# Patient Record
Sex: Female | Born: 2000 | Race: White | Hispanic: No | Marital: Single | Smoking: Current every day smoker
Health system: Southern US, Community
[De-identification: ages and names within clinical notes are randomized; demographics above are authoritative.]

## PROBLEM LIST (undated history)

## (undated) DIAGNOSIS — F909 Attention-deficit hyperactivity disorder, unspecified type: Secondary | ICD-10-CM

## (undated) DIAGNOSIS — E282 Polycystic ovarian syndrome: Secondary | ICD-10-CM

## (undated) DIAGNOSIS — F840 Autistic disorder: Secondary | ICD-10-CM

## (undated) DIAGNOSIS — R4689 Other symptoms and signs involving appearance and behavior: Secondary | ICD-10-CM

## (undated) DIAGNOSIS — R002 Palpitations: Secondary | ICD-10-CM

## (undated) HISTORY — DX: Palpitations: R00.2

---
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
## 2021-09-25 ENCOUNTER — Encounter: Payer: Self-pay | Admitting: Emergency Medicine

## 2021-09-25 ENCOUNTER — Emergency Department (INDEPENDENT_AMBULATORY_CARE_PROVIDER_SITE_OTHER)
Admission: EM | Admit: 2021-09-25 | Discharge: 2021-09-25 | Disposition: A | Discharge status: Home / Self Care | Payer: PRIVATE HEALTH INSURANCE | Source: Home / Self Care | Attending: Family Medicine | Admitting: Family Medicine

## 2021-09-25 DIAGNOSIS — J111 Influenza due to unidentified influenza virus with other respiratory manifestations: Secondary | ICD-10-CM | POA: Diagnosis not present

## 2021-09-25 LAB — POCT INFLUENZA A/B
Influenza A, POC: NEGATIVE
Influenza B, POC: NEGATIVE

## 2021-09-25 LAB — POC SARS CORONAVIRUS 2 AG -  ED: SARS Coronavirus 2 Ag: NEGATIVE

## 2021-09-25 NOTE — Discharge Instructions (Signed)
Continue drinking lots of fluids ?May take over-the-counter cough and cold medicine as needed ?Return as needed ? ?

## 2021-09-25 NOTE — ED Provider Notes (Signed)
?[facility] ? ? ? ?CSN: [id number] ?Arrival date & time: 09/25/21  1213 ? ? ?  ? ?History   ?Chief Complaint ?Chief Complaint  ?Patient presents with  ? Generalized Body Aches  ? ? ?HPI ?Christon Gallaway is a 21 y.o. adult.  ? ?HPI ? ?Neita Goodnight is here for upper respiratory infection.  Had a strep test earlier in the week primary care.  Continues to have a sore throat, runny nose, body aches, mental fog, and fatigue.  States he has had mono twice.  Strep test was negative.  Because of the worsening body aches wants additional testing.  Flu and COVID test done here today are negative.  Also complains of recurring thrush.  Neita Goodnight states he is immunocompromise because of his Crohn's disease and medication.  He gets recurring thrush.  Is currently using nystatin ? ?Past Medical History:  ?Diagnosis Date  ? ADHD   ? Autism   ? Body integrity dysphoria   ? PCOS (polycystic ovarian syndrome)   ? ? ?Patient Active Problem List  ? Diagnosis Date Noted  ? Bipolar 2 disorder (HCC) 11/06/2019  ? Attention deficit hyperactivity disorder (ADHD), combined type 10/17/2019  ? Crohn disease (HCC) 10/17/2019  ? 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 History of OCD (obsessive compulsive disorder) 10/17/2019  ? Anxiety 01/06/2017  ? ? ?History reviewed. No pertinent surgical history. ? ?OB History   ?No obstetric history on file. ?  ? ? ? ?Home Medications   ? ?Prior to Admission medications   ?Medication Sig Start Date End Date Taking? Authorizing Provider  ?nystatin (MYCOSTATIN) 100000 UNIT/ML suspension Take by mouth. 09/23/21 10/03/21 Yes [provider]  ?busPIRone (BUSPAR) 10 MG tablet Take 10 mg by mouth at bedtime. 05/26/21   [provider]  ?dutasteride (AVODART) 0.5 MG capsule Take 0.5 mg by mouth daily. 08/23/21   [provider]  ?gabapentin (NEURONTIN) 300 MG capsule SMARTSIG:1 Capsule(s) By Mouth 1-2 Times Daily PRN 05/26/21   [provider]  ?testosterone cypionate (DEPOTESTOSTERONE CYPIONATE)  200 MG/ML injection Inject into the muscle. 12/31/19   [provider]  ? ? ?Family History ?Family History  ?Problem Relation Age of Onset  ? Healthy Mother   ? Hypertension Father   ? Crohn's disease Sister   ? Crohn's disease Brother   ? Healthy Sister   ? ? ?Social History ?Social History  ? ?Tobacco Use  ? Smoking status: Some Days  ?  Types: E-cigarettes  ? Smokeless tobacco: Never  ?Vaping Use  ? Vaping Use: Some days  ? Substances: Nicotine  ?Substance Use Topics  ? Alcohol use: Never  ? Drug use: Never  ? ? ? ?Allergies   ?Pineapple ? ? ?Review of Systems ?Review of Systems ?See HPI ? ?Physical Exam ?Triage Vital Signs ?ED Triage Vitals  ?Enc Vitals Group  ?   BP 09/25/21 1232 109/75  ?   Pulse Rate 09/25/21 1232 83  ?   Resp 09/25/21 1232 15  ?   Temp 09/25/21 1232 98.4 ?F (36.9 ?C)  ?   Temp Source 09/25/21 1232 Oral  ?   SpO2 09/25/21 1232 97 %  ?   Weight 09/25/21 1235 155 lb (70.3 kg)  ?   Height 09/25/21 1235 5' 4.5" (1.638 m)  ?   Head Circumference --   ?   Peak Flow --   ?   Pain Score 09/25/21 1234 4  ?   Pain Loc --   ?   Pain  Edu? --   ?   Excl. in GC? --   ? ?No data found. ? ?Updated Vital Signs ?BP 109/75 (BP Location: Left Arm)   Pulse 83   Temp 98.4 ?F (36.9 ?C) (Oral)   Resp 15   Ht 5' 4.5" (1.638 m)   Wt 70.3 kg   SpO2 97%   BMI 26.19 kg/m?  ?   ? ?Physical Exam ?Constitutional:   ?   General: He is not in acute distress. ?   Appearance: He is well-developed and normal weight.  ?HENT:  ?   Head: Normocephalic and atraumatic.  ?   Right Ear: Tympanic membrane and ear canal normal.  ?   Left Ear: Tympanic membrane and ear canal normal.  ?   Nose: Nose normal.  ?   Mouth/Throat:  ?   Pharynx: Posterior oropharyngeal erythema present.  ?Eyes:  ?   Conjunctiva/sclera: Conjunctivae normal.  ?   Pupils: Pupils are equal, round, and reactive to light.  ?Cardiovascular:  ?   Rate and Rhythm: Normal rate.  ?Pulmonary:  ?   Effort: Pulmonary effort is normal. No respiratory distress.   ?   Breath sounds: Normal breath sounds.  ?Abdominal:  ?   General: There is no distension.  ?   Palpations: Abdomen is soft.  ?Musculoskeletal:     ?   General: Normal range of motion.  ?   Cervical back: Normal range of motion.  ?Lymphadenopathy:  ?   Cervical: No cervical adenopathy.  ?Skin: ?   General: Skin is warm and dry.  ?Neurological:  ?   General: No focal deficit present.  ?   Mental Status: He is alert.  ? ? ? ?UC Treatments / Results  ?Labs ?(all labs ordered are listed, but only abnormal results are displayed) ?Labs Reviewed  ?POC SARS CORONAVIRUS 2 AG -  ED  ?POCT INFLUENZA A/B  ? ? ?EKG ? ? ?Radiology ?No results found. ? ?Procedures ?Procedures (including critical care time) ? ?Medications Ordered in UC ?Medications - No data to display ? ?Initial Impression / Assessment and Plan / UC Course  ?I have reviewed the triage vital signs and the nursing notes. ? ?Pertinent labs & imaging results that were available during my care of the patient were reviewed by me and considered in my medical decision making (see chart for details). ? ?  ? ?COVID, flu, and strep test is negative.  Discussed viral illness and symptomatic care. ?Oropharynx looks benign.  Do not see thrush plaques.  Patient states that he has been treating this at home for couple of days and it is improving. ?Final Clinical Impressions(s) / UC Diagnoses  ? ?Final diagnoses:  ?Influenza-like illness  ? ? ? ?Discharge Instructions   ? ?  ?Continue drinking lots of fluids ?May take over-the-counter cough and cold medicine as needed ?Return as needed ? ? ? ?ED Prescriptions   ?None ?  ? ?PDMP not reviewed this encounter. ?  ?Eustace Moore, MD ?09/25/21 1335 ? ?

## 2021-09-25 NOTE — ED Triage Notes (Signed)
Pt has thrush again  ?Pt  had thrush  3 weeks ago  ?Currently on medicine for thrush  (started Thursday) ?Negative rapid Strep & culture on Wednesday  ?Increased body aches w/ runny nose & cough ?Fatigue  ?Tylenol 1 gm at 0745 ?Pt denies being sexually active  ?No one is sick at home  ? ?

## 2021-11-24 ENCOUNTER — Emergency Department (INDEPENDENT_AMBULATORY_CARE_PROVIDER_SITE_OTHER)
Admission: EM | Admit: 2021-11-24 | Discharge: 2021-11-24 | Disposition: A | Discharge status: Home / Self Care | Payer: PRIVATE HEALTH INSURANCE | Source: Home / Self Care | Attending: Family Medicine | Admitting: Family Medicine

## 2021-11-24 ENCOUNTER — Encounter: Payer: Self-pay | Admitting: Emergency Medicine

## 2021-11-24 DIAGNOSIS — R002 Palpitations: Secondary | ICD-10-CM

## 2021-11-24 NOTE — ED Triage Notes (Signed)
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

## 2021-11-24 NOTE — Discharge Instructions (Signed)
If symptoms become significantly worse during the night or over the weekend, proceed to the local emergency room.  

## 2021-11-25 ENCOUNTER — Telehealth: Payer: Self-pay

## 2021-11-25 LAB — COMPLETE METABOLIC PANEL WITH GFR
AG Ratio: 1.9 (calc) (ref 1.0–2.5)
ALT: 11 U/L (ref 6–29)
AST: 17 U/L (ref 10–30)
Albumin: 4.7 g/dL (ref 3.6–5.1)
Alkaline phosphatase (APISO): 79 U/L (ref 31–125)
BUN: 12 mg/dL (ref 7–25)
CO2: 23 mmol/L (ref 20–32)
Calcium: 9.4 mg/dL (ref 8.6–10.2)
Chloride: 104 mmol/L (ref 98–110)
Creat: 0.81 mg/dL (ref 0.50–0.96)
Globulin: 2.5 g/dL (calc) (ref 1.9–3.7)
Glucose, Bld: 82 mg/dL (ref 65–99)
Potassium: 4.3 mmol/L (ref 3.5–5.3)
Sodium: 139 mmol/L (ref 135–146)
Total Bilirubin: 0.9 mg/dL (ref 0.2–1.2)
Total Protein: 7.2 g/dL (ref 6.1–8.1)
eGFR: 107 mL/min/{1.73_m2} (ref >=60)

## 2021-11-25 LAB — CBC WITH DIFFERENTIAL/PLATELET
Absolute Monocytes: 274 cells/uL (ref 200–950)
Basophils Absolute: 20 cells/uL (ref 0–200)
Basophils Relative: 0.4 %
Eosinophils Absolute: 0 cells/uL — ABNORMAL LOW (ref 15–500)
Eosinophils Relative: 0 %
HCT: 45.9 % — ABNORMAL HIGH (ref 35.0–45.0)
Hemoglobin: 15.5 g/dL (ref 11.7–15.5)
Lymphs Abs: 1568 cells/uL (ref 850–3900)
MCH: 31.2 pg (ref 27.0–33.0)
MCHC: 33.8 g/dL (ref 32.0–36.0)
MCV: 92.4 fL (ref 80.0–100.0)
MPV: 9.7 fL (ref 7.5–12.5)
Monocytes Relative: 5.6 %
Neutro Abs: 3038 cells/uL (ref 1500–7800)
Neutrophils Relative %: 62 %
Platelets: 234 10*3/uL (ref 140–400)
RBC: 4.97 10*6/uL (ref 3.80–5.10)
RDW: 12.8 % (ref 11.0–15.0)
Total Lymphocyte: 32 %
WBC: 4.9 10*3/uL (ref 3.8–10.8)

## 2021-11-25 LAB — T4, FREE: Free T4: 1.2 ng/dL (ref 0.8–1.4)

## 2021-11-25 LAB — TSH: TSH: 1.36 mIU/L

## 2021-11-25 NOTE — Telephone Encounter (Signed)
Call made to pt. Informed of normal lab results. Had f/u with heartcare at end of Aug. Recommended calling and asking about getting on cancellation list. ED if sxs worsen. Please call if any additional questions.

## 2021-11-27 NOTE — ED Provider Notes (Signed)
[facility]    CSN: [id number] Arrival date & time: 11/24/21  1203      History   Chief Complaint Chief Complaint  Patient presents with   Tachycardia    HPI Erika Estrada is a 21 y.o. adult.   Patient complains of having an intermittently rapid heart rate for the past 3 days.  During this time he has felt fatigued and shaky at times.  Yesterday his heart rate spontaneously increased to 130 for about 2 hours, occasionally reaching 150.  He felt somewhat light-headed with nausea but no vomiting.  His symptoms do not improve after eating.  He feels well otherwise without chest pain, shortness of breath, fever, etc, and is assymptomatic at present.  He has a past history of panic disorder. He has a family history of tachycardia in his maternal grandmother who required ablation. Review of previous records reveals a normal TSH and CBC on 09/16/20.  The history is provided by the patient.    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 No pertinent surgical history.  OB History   No obstetric history on file.      Home Medications    Prior to Admission medications   Medication Sig Start Date End Date Taking? Authorizing Provider  busPIRone (BUSPAR) 10 MG tablet Take 10 mg by mouth at bedtime. Patient not taking: Reported on 11/24/2021 05/26/21   [provider]  dutasteride (AVODART) 0.5 MG capsule Take 0.5 mg by mouth daily. 08/23/21   [provider]  gabapentin (NEURONTIN) 300 MG capsule SMARTSIG:1 Capsule(s) By Mouth 1-2 Times Daily  PRN 05/26/21   [provider]  testosterone cypionate (DEPOTESTOSTERONE CYPIONATE) 200 MG/ML injection Inject into the muscle. 12/31/19   [provider]  VRAYLAR 1.5 MG capsule Take 1.5 mg by mouth. 11/11/21   [provider]  zaleplon (SONATA) 5 MG capsule Take 5 mg by mouth at bedtime. 11/17/21   [provider]    Family History Family History  Problem Relation Age of Onset   Healthy Mother    Hypertension Father    Crohn's disease Sister    Crohn's disease Brother    Healthy Sister     Social History Social History   Tobacco Use   Smoking status: Some Days    Types: E-cigarettes   Smokeless tobacco: Never  Vaping Use   Vaping Use: Some days   Substances: Nicotine  Substance Use Topics   Alcohol use: Never   Drug use: Never     Allergies   Pineapple   Review of Systems Review of Systems  Constitutional:  Positive for fatigue. Negative for activity change, appetite change, chills, diaphoresis and fever.  HENT: Negative.    Eyes: Negative.   Respiratory: Negative.    Cardiovascular:  Positive for palpitations. Negative for chest pain and leg swelling.  Gastrointestinal:  Positive for nausea. Negative for abdominal pain and vomiting.  Endocrine: Negative.   Genitourinary: Negative.   Musculoskeletal: Negative.   Skin: Negative.   Neurological: Negative.   Hematological: Negative.  Psychiatric/Behavioral: Negative.       Physical Exam Triage Vital Signs ED Triage Vitals  Enc Vitals Group     BP 11/24/21 1230 110/79     Pulse Rate 11/24/21 1230 95     Resp 11/24/21 1230 15     Temp 11/24/21 1230 99.3 F (37.4 C)     Temp Source 11/24/21 1230 Oral     SpO2 11/24/21 1230 98 %     Weight 11/24/21 1234 145 lb (65.8 kg)     Height 11/24/21 1234 5' 4.5" (1.638 m)     Head Circumference --      Peak Flow --      Pain Score 11/24/21 1234 2     Pain Loc --      Pain Edu? --      Excl. in [place]? --    No data  found.  Updated Vital Signs BP 110/79 (BP Location: Left Arm)   Pulse 95   Temp 99.3 F (37.4 C) (Oral)   Resp 15   Ht 5' 4.5" (1.638 m)   Wt 65.8 kg   LMP 10/27/2021   SpO2 98%   BMI 24.50 kg/m   Visual Acuity Right Eye Distance:   Left Eye Distance:   Bilateral Distance:    Right Eye Near:   Left Eye Near:    Bilateral Near:     Physical Exam Vitals and nursing note reviewed.  Constitutional:      General: He is not in acute distress.    Appearance: Normal appearance. He is not ill-appearing.  HENT:     Head: Normocephalic.     Right Ear: External ear normal.     Left Ear: External ear normal.     Nose: Nose normal.     Mouth/Throat:     Mouth: Mucous membranes are moist.     Pharynx: Oropharynx is clear.  Eyes:     Extraocular Movements: Extraocular movements intact.     Conjunctiva/sclera: Conjunctivae normal.     Pupils: Pupils are equal, round, and reactive to light.  Neck:     Comments: No thyromegaly Cardiovascular:     Rate and Rhythm: Normal rate and regular rhythm.     Heart sounds: Normal heart sounds.  Pulmonary:     Breath sounds: Normal breath sounds.  Abdominal:     Palpations: Abdomen is soft.     Tenderness: There is no abdominal tenderness.  Musculoskeletal:     Cervical back: Neck supple.     Right lower leg: No edema.     Left lower leg: No edema.  Lymphadenopathy:     Cervical: No cervical adenopathy.  Skin:    General: Skin is warm and dry.  Neurological:     Mental Status: He is alert and oriented to person, place, and time.  Psychiatric:        Mood and Affect: Mood normal.        Behavior: Behavior normal.      UC Treatments / Results  Labs (all labs ordered are listed, but only abnormal results are displayed) Labs Reviewed  CBC WITH DIFFERENTIAL/PLATELET - Abnormal; Notable for the following components:      Result Value   HCT 45.9 (*)    Eosinophils Absolute 0 (*)    All other components within normal limits  TSH   COMPLETE METABOLIC PANEL WITH GFR  T4, FREE    EKG  Rate:   86 BPM PR:  144 msec QT:  356 msec QTcH:   426 msec QRSD:   90 msec QRS axis:   79 degrees Interpretation:  Normal EKG; normal sinus rhythm with sinus arrhythmia.  Radiology No results found.  Procedures Procedures (including critical care time)  Medications Ordered in UC Medications - No data to display  Initial Impression / Assessment and Plan / UC Course  I have reviewed the triage vital signs and the nursing notes.  Pertinent labs & imaging results that were available during my care of the patient were reviewed by me and considered in my medical decision making (see chart for details).    Benign exam.  Note normal exam and EKG.  Recommend follow-up with cardiologist or PCP for event monitor. Check TSH, CBC, CMP, free T4  Final Clinical Impressions(s) / UC Diagnoses   Final diagnoses:  Palpitations     Discharge Instructions      If symptoms become significantly worse during the night or over the weekend, proceed to the local emergency room.     ED Prescriptions   None       Lattie Haw, MD 11/27/21 1303

## 2021-12-14 ENCOUNTER — Ambulatory Visit (INDEPENDENT_AMBULATORY_CARE_PROVIDER_SITE_OTHER): Payer: PRIVATE HEALTH INSURANCE | Admitting: Interventional Cardiology

## 2021-12-14 ENCOUNTER — Encounter: Payer: Self-pay | Admitting: *Deleted

## 2021-12-14 ENCOUNTER — Encounter: Payer: Self-pay | Admitting: Interventional Cardiology

## 2021-12-14 ENCOUNTER — Ambulatory Visit (INDEPENDENT_AMBULATORY_CARE_PROVIDER_SITE_OTHER): Payer: PRIVATE HEALTH INSURANCE

## 2021-12-14 VITALS — BP 113/70 | Ht 64.0 in | Wt 145.0 lb

## 2021-12-14 DIAGNOSIS — I479 Paroxysmal tachycardia, unspecified: Secondary | ICD-10-CM | POA: Diagnosis not present

## 2021-12-14 DIAGNOSIS — R002 Palpitations: Secondary | ICD-10-CM

## 2021-12-14 NOTE — Progress Notes (Signed)
Cardiology Office Note   Date:  12/14/2021   ID:  Erika Estrada, DOB 2001/04/21, MRN [id number]  PCP:  Erika Dicker, FNP    No chief complaint on file.  palpitations  Wt Readings from Last 3 Encounters:  12/14/21 145 lb (65.8 kg)  11/24/21 145 lb (65.8 kg)  09/25/21 155 lb (70.3 kg)       History of Present Illness: Erika Estrada is a 21 y.o. adult who is being seen today for the evaluation of palpitations at the request of Erika Haw, MD.   He has had several trips to the emergency room in the past month due to palpitations.  Records show: "heart palpitations that started on Monday. States that he has been measuring his heart rate and has gone up to about 140. When his heart rate goes up in the has been having some dizziness and tremors. Patient also reports rectal bleeding. States that he has a history of Crohn's disease and has noted some bright red bleeding when wiping. He denies fever, chills, vomiting, shortness of breath. When he has the palpitations he states he has some chest pain. Currently denying any chest pain, currently denying palpitations. Denies urinary symptoms like burning or increased frequency. No urinary discharge."  He is also noted that heart rate increases with standing or with any activity.  He had heme positive stool and has a history of Crohn's disease.  Of note, hemoglobin was 15.5 on November 24, 2021.  Will be seeing GI, Dr. Sharol Estrada- [facility].   HR has been high at various jobs, up to 170s.  He quit one job due to the high HR.   Denies : Chest pain. Leg edema. Nitroglycerin use. Orthopnea. Palpitations. Paroxysmal nocturnal dyspnea.  Syncope.     Past Medical History:  Diagnosis Date   ADHD    Autism    Body integrity dysphoria    Palpitation    PCOS (polycystic ovarian syndrome)     No past surgical history on file.   Current Outpatient Medications  Medication Sig Dispense Refill   dutasteride (AVODART) 0.5 MG capsule Take 0.5 mg by  mouth daily.     eszopiclone (LUNESTA) 2 MG TABS tablet Take 2 mg by mouth at bedtime as needed for sleep.     gabapentin (NEURONTIN) 300 MG capsule SMARTSIG:1 Capsule(s) By Mouth 1-2 Times Daily PRN     testosterone cypionate (DEPOTESTOSTERONE CYPIONATE) 200 MG/ML injection Inject into the muscle.     VRAYLAR 1.5 MG capsule Take 1.5 mg by mouth.     No current facility-administered medications for this visit.    Allergies:   Pineapple    Social History:  The patient  reports that he has been smoking e-cigarettes. He has never used smokeless tobacco. He reports that he does not drink alcohol and does not use drugs.   Family History:  The patient's family history includes Crohn's disease in his brother and sister; Healthy in his mother and sister; Hypertension in his father. Grandmother with tachycardia, ablation in her 29s.    ROS:  Please see the history of present illness.   Otherwise, review of systems are positive for palpitations.   All other systems are reviewed and negative.    PHYSICAL EXAM: VS:  Ht 5\' 4"  (1.626 m)   Wt 145 lb (65.8 kg)   LMP 10/27/2021   SpO2 98%   BMI 24.89 kg/m  , BMI Body mass index is 24.89 kg/m. GEN: Well nourished, well developed,  in no acute distress HEENT: normal Neck: no JVD, carotid bruits, or masses Cardiac: RRR; no murmurs, rubs, or gallops,no edema  Respiratory:  clear to auscultation bilaterally, normal work of breathing GI: soft, nontender, nondistended, + BS MS: no deformity or atrophy Skin: warm and dry, no rash Neuro:  Strength and sensation are intact Psych: euthymic mood, full affect   EKG:   The ekg ordered 11/24/21 demonstrates normal ECG   Recent Labs: 11/24/2021: ALT 11; BUN 12; Creat 0.81; Hemoglobin 15.5; Platelets 234; Potassium 4.3; Sodium 139; TSH 1.36   Lipid Panel No results found for: "CHOL", "TRIG", "HDL", "CHOLHDL", "VLDL", "LDLCALC", "LDLDIRECT"   Other studies Reviewed: Additional studies/ records that were  reviewed today with results demonstrating: ER records reviewed.   ASSESSMENT AND PLAN:  Palpitations: Heart rates ranging anywhere from 140s to 170s.  Need to determine whether this is sinus tachycardia or if this is an actual SVT.  We will plan for 2 weeks Zio patch. Tachycardia: Noted above we will check echocardiogram to ensure normal LV and RV function. We discussed POTS.  I emphasized the importance of staying well-hydrated.  Increasing salt intake may also be helpful.  The symptoms sounds a lot like POTS.  Of note, the patient has decreased salt intake recently in an effort to decrease intake of processed foods. Of note, no significant change in systolic BP when changing position.  113 to 117 mm Hg.    Current medicines are reviewed at length with the patient today.  The patient concerns regarding his medicines were addressed.  The following changes have been made:  No change  Labs/ tests ordered today include:   Orders Placed This Encounter  Procedures   LONG TERM MONITOR (3-14 DAYS)   ECHOCARDIOGRAM COMPLETE    Recommend 150 minutes/week of aerobic exercise Low fat, low carb, high fiber diet recommended  Disposition:   FU based on test results   Signed, Lance Muss, MD  12/14/2021 2:39 PM    [facility] [address] [place], [place], [place]  [address] Phone: [phone]; Fax: [phone]

## 2021-12-14 NOTE — Progress Notes (Unsigned)
Patient ID: Erika Estrada, adult   DOB: 2001-02-25, 21 y.o.   MRN: [id number] Patient plans on swimming within the next 2 weeks so, patient enrolled for [employer] to ship a waterproof 14 day long term monitor to his address on file.    Dr. Eldridge Dace to read.

## 2021-12-14 NOTE — Patient Instructions (Addendum)
Medication Instructions:  Your physician recommends that you continue on your current medications as directed. Please refer to the Current Medication list given to you today.  *If you need a refill on your cardiac medications before your next appointment, please call your pharmacy*   Lab Work: none If you have labs (blood work) drawn today and your tests are completely normal, you will receive your results only by: MyChart Message (if you have MyChart) OR A paper copy in the mail If you have any lab test that is abnormal or we need to change your treatment, we will call you to review the results.   Testing/Procedures: Your physician has requested that you have an echocardiogram. Echocardiography is a painless test that uses sound waves to create images of your heart. It provides your doctor with information about the size and shape of your heart and how well your heart's chambers and valves are working. This procedure takes approximately one hour. There are no restrictions for this procedure.   Dr Eldridge Dace recommends you wear a monitor for 2 weeks.   Follow-Up: At [facility], you and your health needs are our priority.  As part of our continuing mission to provide you with exceptional heart care, we have created designated Provider Care Teams.  These Care Teams include your primary Cardiologist (physician) and Advanced Practice Providers (APPs -  Physician Assistants and Nurse Practitioners) who all work together to provide you with the care you need, when you need it.  We recommend signing up for the patient portal called "MyChart".  Sign up information is provided on this After Visit Summary.  MyChart is used to connect with patients for Virtual Visits (Telemedicine).  Patients are able to view lab/test results, encounter notes, upcoming appointments, etc.  Non-urgent messages can be sent to your provider as well.   To learn more about what you can do with MyChart, go to  [email or web address].    Your next appointment:   As needed  The format for your next appointment:   In Person  Provider:   Lance Muss, MD     Other Instructions    Important Information About Sugar

## 2021-12-14 NOTE — Progress Notes (Unsigned)
Patient plans on swimming within next 2 weeks.  [employer] 14 day waterproof long term monitor shipped to his address on file.  Dr. Abe People to read.

## 2021-12-17 ENCOUNTER — Ambulatory Visit (HOSPITAL_COMMUNITY): Payer: PRIVATE HEALTH INSURANCE | Attending: Interventional Cardiology

## 2021-12-17 DIAGNOSIS — R002 Palpitations: Secondary | ICD-10-CM | POA: Diagnosis not present

## 2021-12-17 DIAGNOSIS — I479 Paroxysmal tachycardia, unspecified: Secondary | ICD-10-CM | POA: Diagnosis present

## 2021-12-17 LAB — ECHOCARDIOGRAM COMPLETE
Area-P 1/2: 3.06 cm2
S' Lateral: 3 cm

## 2021-12-20 DIAGNOSIS — R002 Palpitations: Secondary | ICD-10-CM

## 2021-12-20 DIAGNOSIS — I479 Paroxysmal tachycardia, unspecified: Secondary | ICD-10-CM | POA: Diagnosis not present

## 2022-01-12 ENCOUNTER — Ambulatory Visit: Payer: PRIVATE HEALTH INSURANCE | Admitting: Cardiology

## 2022-01-28 IMAGING — DX DG FOOT COMPLETE 3+V*R*
3 series · 3 of 3 positions shown · non-contrast
Comparison: None.

CLINICAL DATA: Right foot pain

EXAM:
RIGHT FOOT COMPLETE - 3+ VIEW

[foot ap]
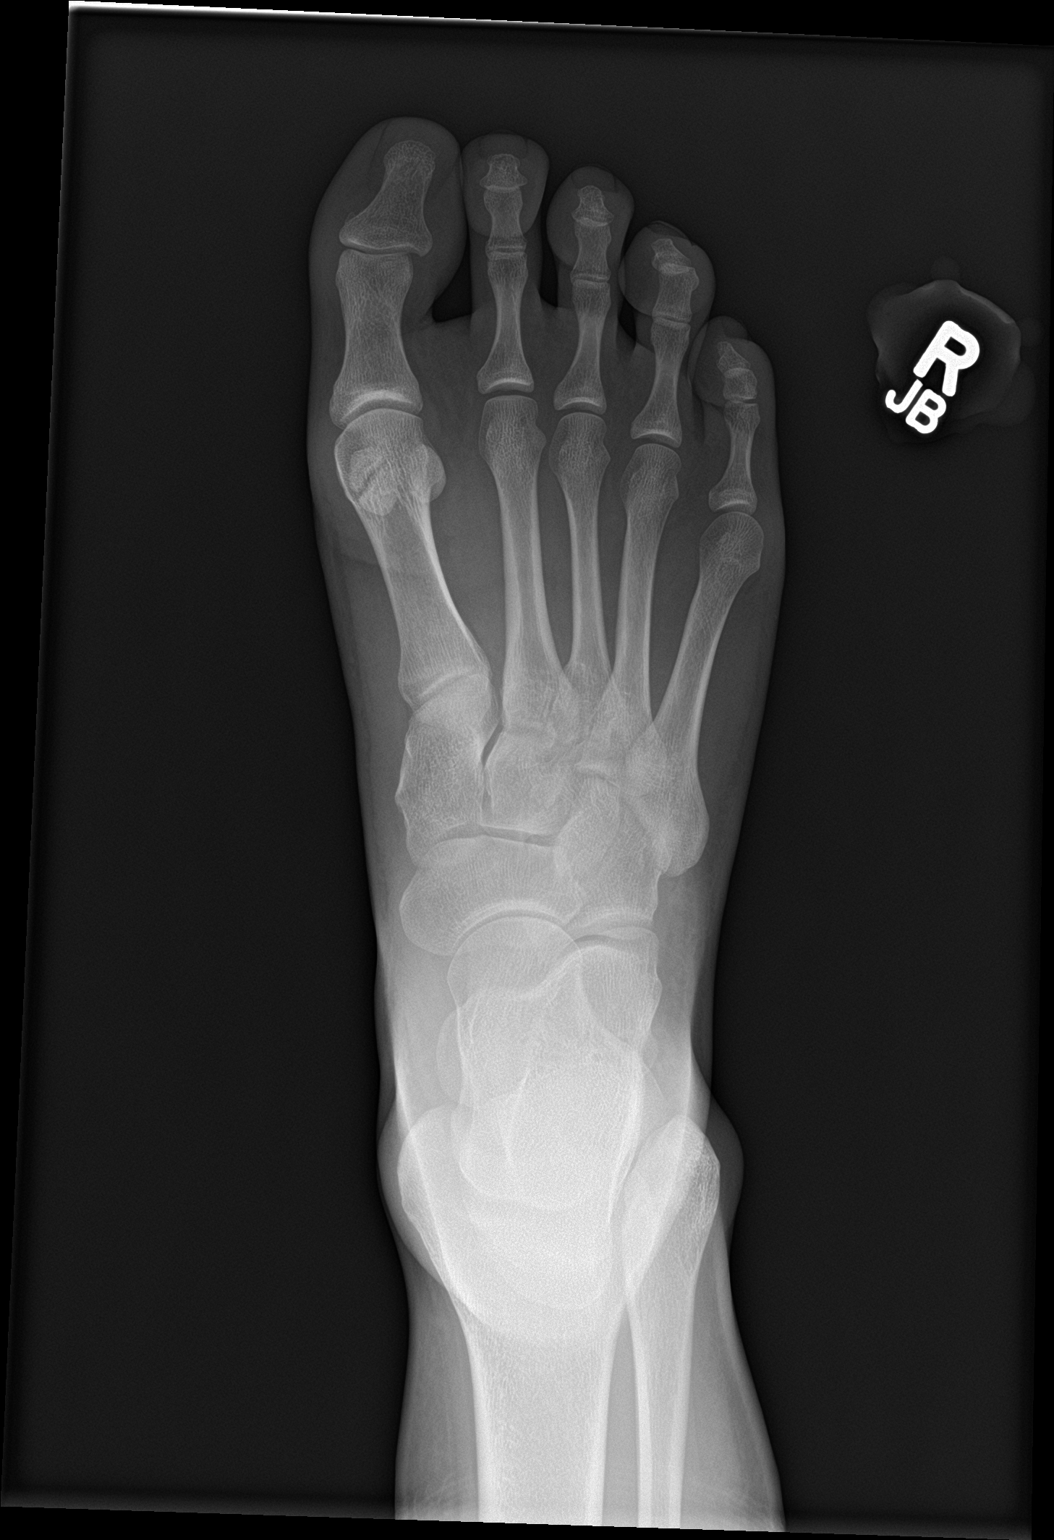

[foot obl]
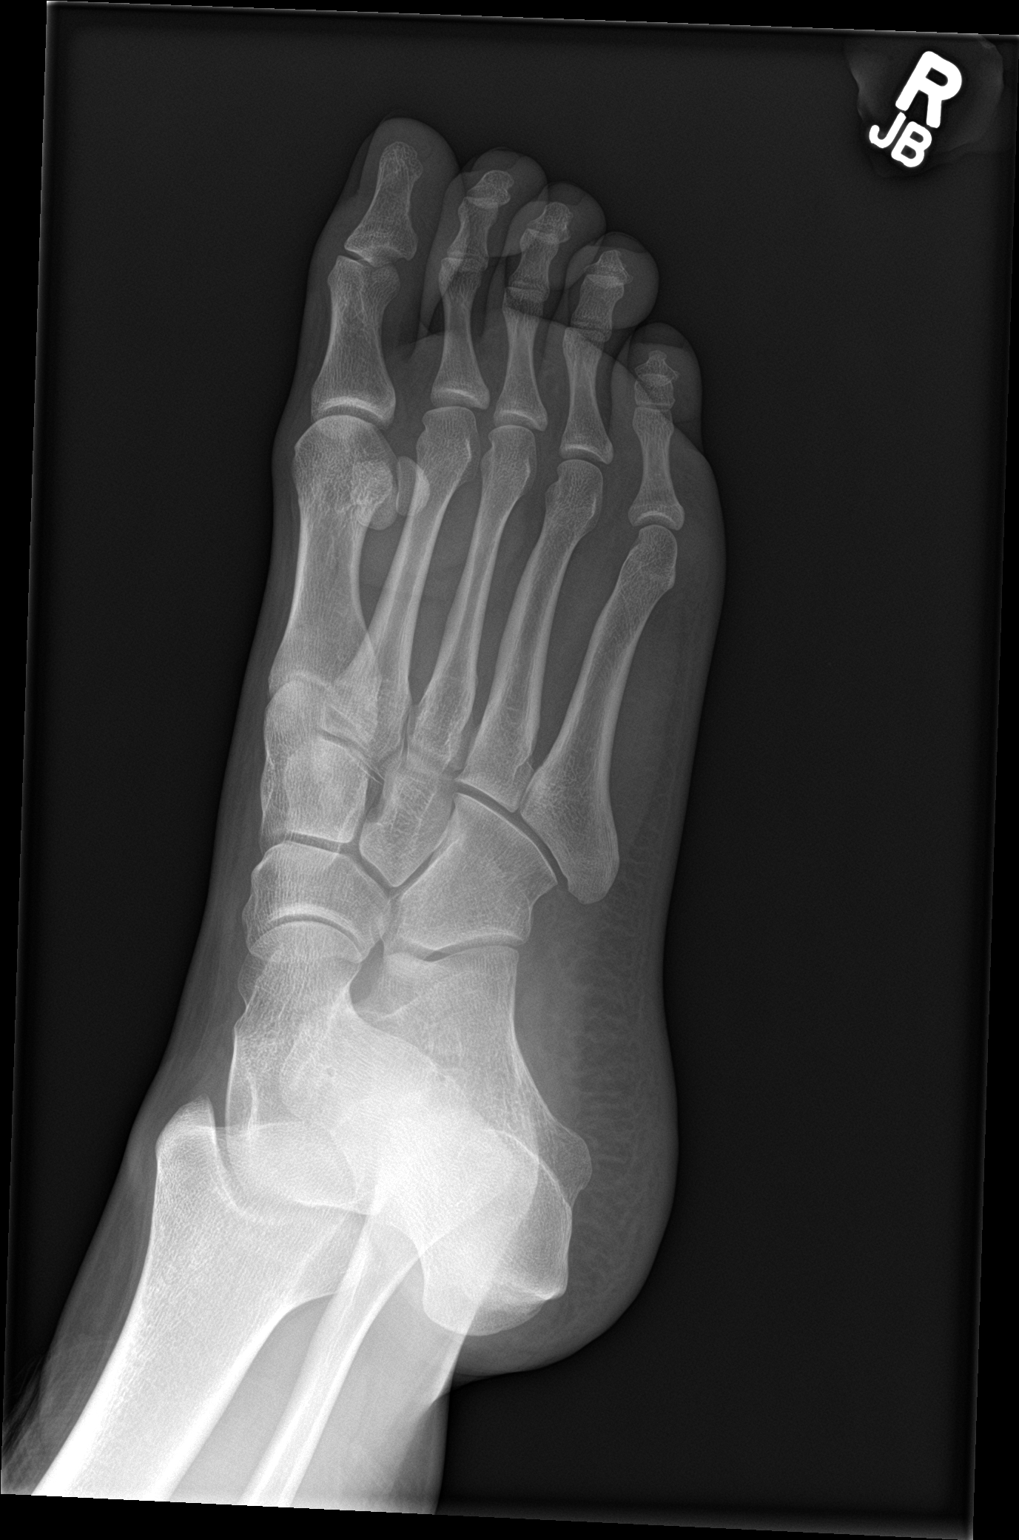

[foot lat]
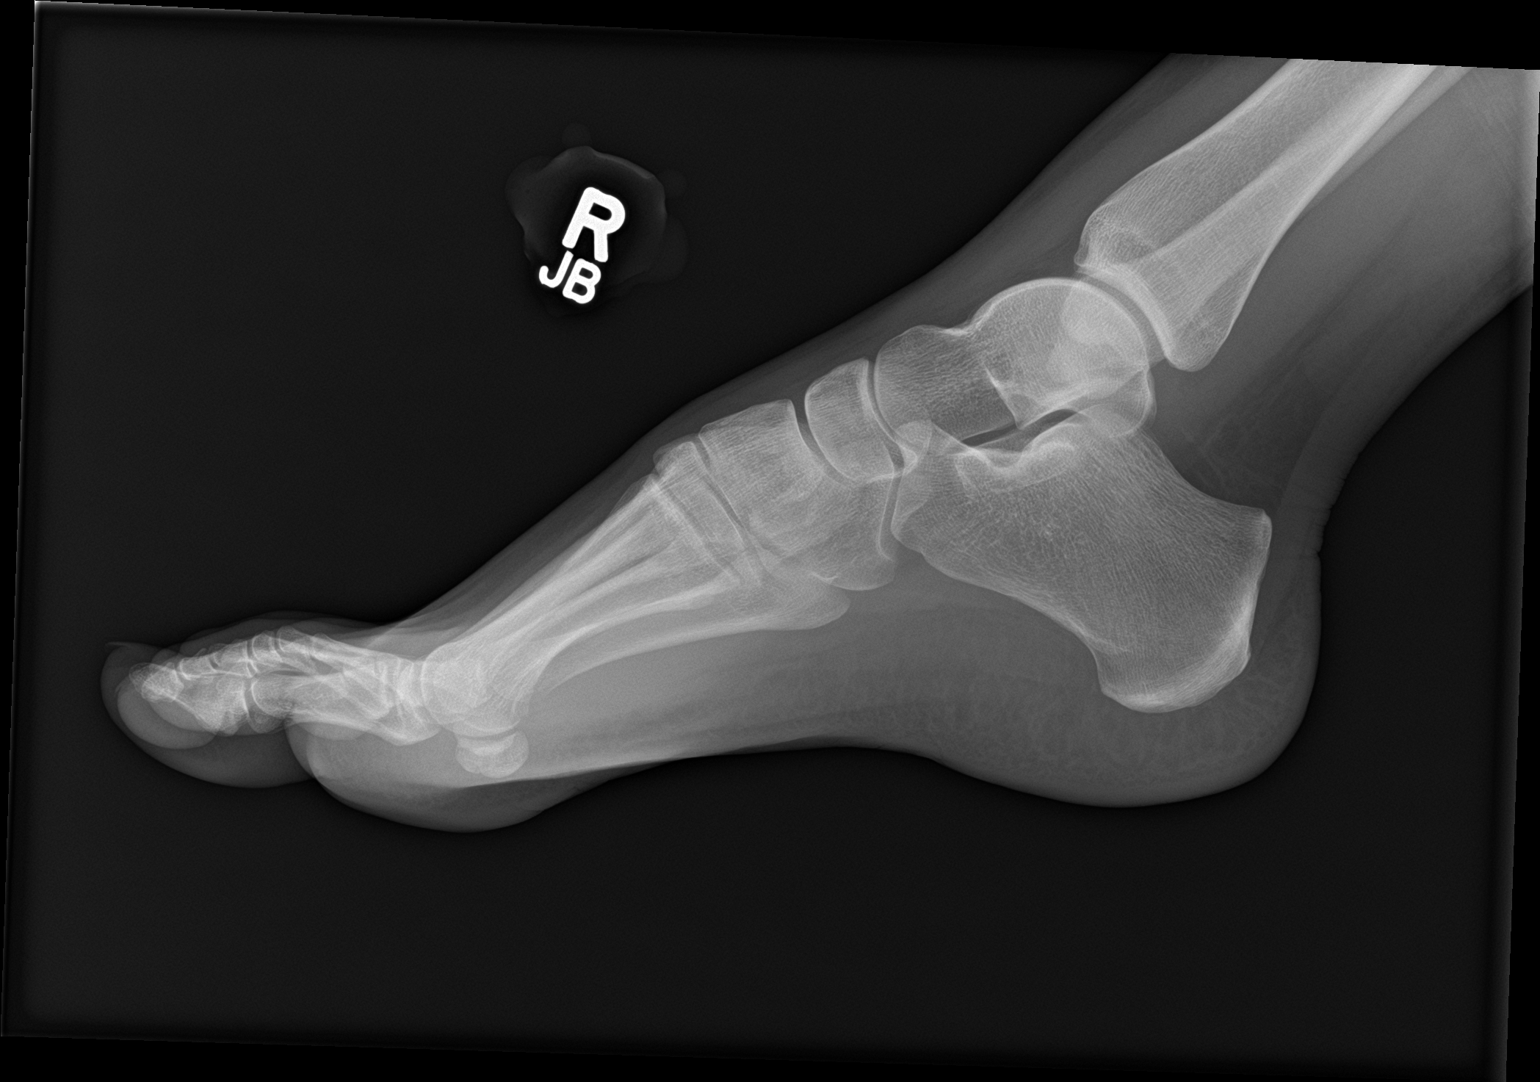

[3 of 3 positions shown; findings below may reference images not displayed]

FINDINGS: No acute fracture or traumatic malalignment is evident within the
limitations of a nonweightbearing exam. Normal bone mineralization.
No worrisome osseous lesion. Bipartite appearance of the medial
hallux sesamoid, normal variant.
IMPRESSION: No acute fracture or traumatic malalignment.

## 2022-05-23 ENCOUNTER — Ambulatory Visit
Admission: EM | Admit: 2022-05-23 | Discharge: 2022-05-23 | Disposition: A | Discharge status: Home / Self Care | Payer: 59

## 2022-05-23 DIAGNOSIS — R059 Cough, unspecified: Secondary | ICD-10-CM | POA: Diagnosis not present

## 2022-05-23 DIAGNOSIS — J101 Influenza due to other identified influenza virus with other respiratory manifestations: Secondary | ICD-10-CM | POA: Diagnosis not present

## 2022-05-23 LAB — POCT INFLUENZA A/B
Influenza A, POC: POSITIVE — AB
Influenza B, POC: NEGATIVE

## 2022-05-23 MED ORDER — OSELTAMIVIR PHOSPHATE 75 MG PO CAPS: 75.0000 mg | ORAL_CAPSULE | Freq: Two times a day (BID) | ORAL | 0 refills | Status: DC

## 2022-05-23 MED ORDER — BENZONATATE 200 MG PO CAPS: 200.0000 mg | ORAL_CAPSULE | Freq: Three times a day (TID) | ORAL | 0 refills | Status: AC | PRN

## 2022-05-23 NOTE — ED Provider Notes (Signed)
[facility]    CSN: [id number] Arrival date & time: 05/23/22  1131      History   Chief Complaint Chief Complaint  Patient presents with   Cough    HPI [name] is a 22 y.o. adult.   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 No pertinent surgical history.  OB History   No obstetric history on file.      Home Medications    Prior to Admission medications   Medication Sig Start Date End Date Taking? Authorizing Provider  benzonatate (TESSALON) 200 MG capsule Take 1 capsule (200 mg total) by mouth 3 (three) times daily as needed for up to 7 days. 05/23/22 05/30/22 Yes Eliezer Lofts, FNP  oseltamivir (TAMIFLU) 75 MG capsule Take 1 capsule (75 mg total) by mouth every 12 (twelve) hours. 05/23/22  Yes Eliezer Lofts, FNP  dutasteride (AVODART) 0.5 MG capsule Take 0.5 mg by mouth daily. 08/23/21   [provider]  eszopiclone (LUNESTA) 2 MG TABS tablet Take 2 mg by mouth at bedtime as needed for sleep. 12/09/21   [provider]  gabapentin (NEURONTIN) 300 MG capsule SMARTSIG:1 Capsule(s) By Mouth 1-2 Times Daily PRN 05/26/21   [provider]  HUMIRA PEN 40 MG/0.4ML PNKT SMARTSIG:40 Milligram(s) SUB-Q Every 2 Weeks    [provider]  testosterone cypionate (DEPOTESTOSTERONE CYPIONATE) 200 MG/ML injection Inject into the muscle. 12/31/19   [provider]  VRAYLAR 1.5 MG capsule Take 1.5 mg by mouth. 11/11/21   [provider]    Family History Family History  Problem  Relation Age of Onset   Healthy Mother    Hypertension Father    Crohn's disease Sister    Crohn's disease Brother    Healthy Sister     Social History Social History   Tobacco Use   Smoking status: Some Days    Types: E-cigarettes   Smokeless tobacco: Never  Vaping Use   Vaping Use: Some days   Substances: Nicotine  Substance Use Topics   Alcohol use: Never   Drug use: Never     Allergies   Pineapple   Review of Systems Review of Systems   Physical Exam Triage Vital Signs ED Triage Vitals  Enc Vitals Group     BP 05/23/22 1140 116/82     Pulse Rate 05/23/22 1140 (!) 125     Resp 05/23/22 1140 18     Temp 05/23/22 1140 99.5 F (37.5 C)     Temp Source 05/23/22 1140 Oral     SpO2 05/23/22 1140 97 %     Weight --      Height --      Head Circumference --      Peak Flow --      Pain Score 05/23/22 1142 4     Pain Loc --      Pain Edu? --      Excl. in [place]? --    No data found.  Updated Vital Signs BP  116/82 (BP Location: Left Arm)   Pulse (!) 125   Temp 99.5 F (37.5 C) (Oral)   Resp 18   SpO2 97%   Visual Acuity Right Eye Distance:   Left Eye Distance:   Bilateral Distance:    Right Eye Near:   Left Eye Near:    Bilateral Near:     Physical Exam Vitals and nursing note reviewed.  Constitutional:      Appearance: Normal appearance. He is obese. He is ill-appearing.  HENT:     Head: Normocephalic and atraumatic.     Right Ear: Tympanic membrane, ear canal and external ear normal.     Left Ear: Tympanic membrane, ear canal and external ear normal.     Mouth/Throat:     Mouth: Mucous membranes are moist.     Pharynx: Oropharynx is clear.  Eyes:     Extraocular Movements: Extraocular movements intact.     Conjunctiva/sclera: Conjunctivae normal.     Pupils: Pupils are equal, round, and reactive to light.  Cardiovascular:     Rate and Rhythm: Normal rate and regular rhythm.     Pulses: Normal pulses.     Heart sounds: Normal heart  sounds. No murmur heard. Pulmonary:     Effort: Pulmonary effort is normal.     Breath sounds: Normal breath sounds. No wheezing, rhonchi or rales.     Comments: Infrequent non-productive cough noted on exam Musculoskeletal:        General: Normal range of motion.     Cervical back: Normal range of motion and neck supple. No tenderness.  Lymphadenopathy:     Cervical: No cervical adenopathy.  Skin:    General: Skin is warm and dry.  Neurological:     General: No focal deficit present.     Mental Status: He is alert and oriented to person, place, and time. Mental status is at baseline.      UC Treatments / Results  Labs (all labs ordered are listed, but only abnormal results are displayed) Labs Reviewed  POCT INFLUENZA A/B - Abnormal; Notable for the following components:      Result Value   Influenza A, POC Positive (*)    All other components within normal limits    EKG   Radiology No results found.  Procedures Procedures (including critical care time)  Medications Ordered in UC Medications - No data to display  Initial Impression / Assessment and Plan / UC Course  I have reviewed the triage vital signs and the nursing notes.  Pertinent labs & imaging results that were available during my care of the patient were reviewed by me and considered in my medical decision making (see chart for details).    MDM: 1.  Influenza A-Rx'd Tamiflu; 2.  Cough, unspecified type-Rx'd Tessalon. Advised patient to take medications as directed with food to completion.  Advised may take OTC Tylenol every 6 hours for fever and/or myalgias.  Advised may take Tessalon daily or as needed for cough.  Encouraged to increase daily water intake to 64 ounces per day while taking these medications.  Advised if symptoms worsen and/or unresolved please follow-up with PCP or here for further evaluation.  Work and school note provided to patient per request prior to discharge. Final Clinical  Impressions(s) / UC Diagnoses   Final diagnoses:  Influenza A  Cough, unspecified type     Discharge Instructions      Advised patient to take medications as directed with food to completion.  Advised may take OTC Tylenol every 6 hours for fever and/or myalgias.  Advised may take Tessalon daily or as needed for cough.  Encouraged to increase daily water intake to 64 ounces per day while taking these medications.  Advised if symptoms worsen and/or unresolved please follow-up with PCP or here for further evaluation.     ED Prescriptions     Medication Sig Dispense Auth. Provider   oseltamivir (TAMIFLU) 75 MG capsule Take 1 capsule (75 mg total) by mouth every 12 (twelve) hours. 10 capsule Trevor Iha, FNP   benzonatate (TESSALON) 200 MG capsule Take 1 capsule (200 mg total) by mouth 3 (three) times daily as needed for up to 7 days. 40 capsule Trevor Iha, FNP      PDMP not reviewed this encounter.   Trevor Iha, FNP 05/23/22 1213

## 2022-05-23 NOTE — ED Triage Notes (Signed)
Pt c/o cough , bodyaches, shortness of breath since yesterday. Tylenol. COVID at home negative yesterday.

## 2022-05-23 NOTE — Discharge Instructions (Addendum)
Advised patient to take medications as directed with food to completion.  Advised may take OTC Tylenol every 6 hours for fever and/or myalgias.  Advised may take Tessalon daily or as needed for cough.  Encouraged to increase daily water intake to 64 ounces per day while taking these medications.  Advised if symptoms worsen and/or unresolved please follow-up with PCP or here for further evaluation.

## 2022-11-12 ENCOUNTER — Ambulatory Visit
Admission: EM | Admit: 2022-11-12 | Discharge: 2022-11-12 | Disposition: A | Discharge status: Home / Self Care | Payer: 59 | Attending: Family Medicine | Admitting: Family Medicine

## 2022-11-12 ENCOUNTER — Other Ambulatory Visit: Payer: Self-pay

## 2022-11-12 ENCOUNTER — Encounter: Payer: Self-pay | Admitting: Emergency Medicine

## 2022-11-12 DIAGNOSIS — J029 Acute pharyngitis, unspecified: Secondary | ICD-10-CM

## 2022-11-12 DIAGNOSIS — J020 Streptococcal pharyngitis: Secondary | ICD-10-CM

## 2022-11-12 LAB — POC SARS CORONAVIRUS 2 AG -  ED: SARS Coronavirus 2 Ag: NEGATIVE

## 2022-11-12 LAB — POCT RAPID STREP A (OFFICE): Rapid Strep A Screen: POSITIVE — AB

## 2022-11-12 MED ORDER — AMOXICILLIN-POT CLAVULANATE 875-125 MG PO TABS: 1.0000 | ORAL_TABLET | Freq: Two times a day (BID) | ORAL | 0 refills | Status: DC

## 2022-11-12 NOTE — ED Triage Notes (Signed)
Woke up w/ a sore throat since Thursday am  Chills & warm at work  Has not checked temp  Neita Goodnight is the only one who is sick at work

## 2022-11-12 NOTE — Discharge Instructions (Addendum)
Instructed patient to take medication as directed with food to completion.  Advised patient may take OTC Ibuprofen 800 mg daily or as needed for sore throat pain.  Encouraged increase daily water intake to 64 ounces per day while taking these medications.  Advised if symptoms worsen and/or unresolved please follow-up PCP or here for further evaluation.

## 2022-11-12 NOTE — ED Provider Notes (Signed)
[facility]    CSN: [id number] Arrival date & time: 11/12/22  1232      History   Chief Complaint Chief Complaint  Patient presents with   Sore Throat    HPI Erika Estrada is a 22 y.o. adult.   HPI 22 year old female presents with sore throat.  PMH significant for ADHD, and autism, and body integrity dysphoria.  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 No pertinent surgical history.  OB History   No obstetric history on file.      Home Medications    Prior to Admission medications   Medication Sig Start Date End Date Taking? Authorizing Provider  amoxicillin-clavulanate (AUGMENTIN) 875-125 MG tablet Take 1 tablet by mouth every 12 (twelve) hours. 11/12/22  Yes Trevor Iha, FNP  dutasteride (AVODART) 0.5 MG capsule Take 0.5 mg by mouth daily. 08/23/21   [provider]  eszopiclone (LUNESTA) 2 MG TABS tablet Take 2 mg by mouth at bedtime as needed for sleep. 12/09/21   [provider]  gabapentin (NEURONTIN) 300 MG capsule SMARTSIG:1 Capsule(s) By Mouth 1-2 Times Daily PRN 05/26/21   [provider]  HUMIRA PEN 40 MG/0.4ML PNKT SMARTSIG:40 Milligram(s) SUB-Q Every 2 Weeks    [provider]  oseltamivir (TAMIFLU) 75 MG capsule Take 1 capsule (75 mg total) by mouth every 12 (twelve) hours. Patient not taking: Reported on 11/12/2022 05/23/22   Trevor Iha, FNP  testosterone cypionate (DEPOTESTOSTERONE CYPIONATE) 200 MG/ML injection Inject into the muscle. 12/31/19   [provider]   VRAYLAR 1.5 MG capsule Take 1.5 mg by mouth. 11/11/21   [provider]    Family History Family History  Problem Relation Age of Onset   Healthy Mother    Hypertension Father    Crohn's disease Sister    Crohn's disease Brother    Healthy Sister     Social History Social History   Tobacco Use   Smoking status: Every Day    Types: E-cigarettes   Smokeless tobacco: Never  Vaping Use   Vaping Use: Every day   Substances: Nicotine, Flavoring  Substance Use Topics   Alcohol use: Never   Drug use: Never     Allergies   Pineapple   Review of Systems Review of Systems   Physical Exam Triage Vital Signs ED Triage Vitals  Enc Vitals Group     BP      Pulse      Resp      Temp      Temp src      SpO2      Weight      Height      Head Circumference      Peak Flow      Pain Score      Pain Loc      Pain Edu?      Excl. in GC?    No data found.  Updated Vital Signs BP 115/81 (BP Location: Left Arm)   Pulse (!) 102  Temp 98.4 F (36.9 C) (Oral)   Resp 14   Ht 5\' 4"  (1.626 m)   Wt 140 lb (63.5 kg)   SpO2 97%   BMI 24.03 kg/m      Physical Exam Vitals and nursing note reviewed.  Constitutional:      Appearance: Normal appearance. He is normal weight. He is not ill-appearing.  HENT:     Head: Normocephalic and atraumatic.     Right Ear: Tympanic membrane, ear canal and external ear normal.     Left Ear: Tympanic membrane, ear canal and external ear normal.     Mouth/Throat:     Mouth: Mucous membranes are moist.     Pharynx: Oropharynx is clear. Uvula midline. No posterior oropharyngeal erythema or uvula swelling.     Tonsils: 1+ on the right. 1+ on the left.  Eyes:     Extraocular Movements: Extraocular movements intact.     Conjunctiva/sclera: Conjunctivae normal.     Pupils: Pupils are equal, round, and reactive to light.  Cardiovascular:     Rate and Rhythm: Normal rate and regular rhythm.     Pulses: Normal pulses.     Heart  sounds: Normal heart sounds.  Pulmonary:     Effort: Pulmonary effort is normal.     Breath sounds: Normal breath sounds. No wheezing, rhonchi or rales.  Musculoskeletal:        General: Normal range of motion.     Cervical back: Normal range of motion and neck supple.  Skin:    General: Skin is warm and dry.  Neurological:     General: No focal deficit present.     Mental Status: He is alert and oriented to person, place, and time. Mental status is at baseline.  Psychiatric:        Mood and Affect: Mood normal.        Behavior: Behavior normal.      UC Treatments / Results  Labs (all labs ordered are listed, but only abnormal results are displayed) Labs Reviewed  POCT RAPID STREP A (OFFICE) - Abnormal; Notable for the following components:      Result Value   Rapid Strep A Screen Positive (*)    All other components within normal limits  POC SARS CORONAVIRUS 2 AG -  ED    EKG   Radiology No results found.  Procedures Procedures (including critical care time)  Medications Ordered in UC Medications - No data to display  Initial Impression / Assessment and Plan / UC Course  I have reviewed the triage vital signs and the nursing notes.  Pertinent labs & imaging results that were available during my care of the patient were reviewed by me and considered in my medical decision making (see chart for details).     MDM: 1.  Strep pharyngitis-rapid strep positive, Rx'd Augmentin 875/125 mg tablet: 1 tablet twice daily x 7 days. Instructed patient to take medication as directed with food to completion. 2. Sore throat-Advised patient may take OTC Ibuprofen 800 mg daily or as needed for sore throat pain.  Encouraged increase daily water intake to 64 ounces per day while taking these medications.  Advised if symptoms worsen and/or unresolved please follow-up PCP or here for further evaluation.  Discharged home, hemodynamically stable.     Final Clinical Impressions(s) / UC  Diagnoses   Final diagnoses:  Strep pharyngitis  Sore throat     Discharge Instructions      Instructed patient to take medication  as directed with food to completion.  Advised patient may take OTC Ibuprofen 800 mg daily or as needed for sore throat pain.  Encouraged increase daily water intake to 64 ounces per day while taking these medications.  Advised if symptoms worsen and/or unresolved please follow-up PCP or here for further evaluation.     ED Prescriptions     Medication Sig Dispense Auth. Provider   amoxicillin-clavulanate (AUGMENTIN) 875-125 MG tablet Take 1 tablet by mouth every 12 (twelve) hours. 14 tablet Trevor Iha, FNP      PDMP not reviewed this encounter.   Trevor Iha, FNP 11/12/22 1434

## 2023-01-20 ENCOUNTER — Ambulatory Visit
Admission: EM | Admit: 2023-01-20 | Discharge: 2023-01-20 | Disposition: A | Discharge status: Home / Self Care | Payer: PRIVATE HEALTH INSURANCE

## 2023-01-20 ENCOUNTER — Encounter: Payer: Self-pay | Admitting: Emergency Medicine

## 2023-01-20 DIAGNOSIS — R071 Chest pain on breathing: Secondary | ICD-10-CM | POA: Diagnosis not present

## 2023-01-20 DIAGNOSIS — R0789 Other chest pain: Secondary | ICD-10-CM

## 2023-01-20 NOTE — ED Triage Notes (Signed)
Patient c/o left sided chest/rib/breast pain x 3 days.  No injury to the area.  Pain can be sharp at times, sometimes dull.  No SOB.  Does hurt when there's a deep breathe taken.  Denies any OTC pain meds.

## 2023-01-20 NOTE — Discharge Instructions (Addendum)
Advised watchful waiting at this point.  Advised patient if symptoms worsen and/or unresolved please follow-up with PCP nearest ED or here for further evaluation.

## 2023-01-20 NOTE — ED Provider Notes (Signed)
[facility]    CSN: [id number] Arrival date & time: 01/20/23  1928      History   Chief Complaint Chief Complaint  Patient presents with   Left sided chest/rib pain x 3 days    HPI Erika Estrada is a 22 y.o. adult.   HPI 22 year old female presents with left rib pain.  Denies injury or insult.  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 No pertinent surgical history.  OB History   No obstetric history on file.      Home Medications    Prior to Admission medications   Medication Sig Start Date End Date Taking? Authorizing Provider  dutasteride (AVODART) 0.5 MG capsule Take 0.5 mg by mouth daily. 08/23/21  Yes [provider]  HUMIRA PEN 40 MG/0.4ML PNKT SMARTSIG:40 Milligram(s) SUB-Q Every 2 Weeks   Yes [provider]  testosterone cypionate (DEPOTESTOSTERONE CYPIONATE) 200 MG/ML injection Inject into the muscle. 12/31/19  Yes [provider]  amoxicillin-clavulanate (AUGMENTIN) 875-125 MG tablet Take 1 tablet by mouth every 12 (twelve) hours. 11/12/22   Trevor Iha, FNP  eszopiclone (LUNESTA) 2 MG TABS tablet Take 2 mg by mouth at bedtime as needed for sleep. 12/09/21   [provider]  gabapentin (NEURONTIN) 300 MG capsule SMARTSIG:1 Capsule(s) By Mouth 1-2 Times Daily PRN 05/26/21   [provider]  oseltamivir (TAMIFLU) 75 MG capsule Take 1 capsule (75 mg total) by mouth every 12 (twelve)  hours. Patient not taking: Reported on 11/12/2022 05/23/22   Trevor Iha, FNP  VRAYLAR 1.5 MG capsule Take 1.5 mg by mouth. 11/11/21   [provider]    Family History Family History  Problem Relation Age of Onset   Healthy Mother    Hypertension Father    Crohn's disease Sister    Crohn's disease Brother    Healthy Sister     Social History Social History   Tobacco Use   Smoking status: Every Day    Types: E-cigarettes   Smokeless tobacco: Never  Vaping Use   Vaping status: Every Day   Substances: Nicotine, Flavoring  Substance Use Topics   Alcohol use: Never   Drug use: Never     Allergies   Pineapple   Review of Systems Review of Systems   Physical Exam Triage Vital Signs ED Triage Vitals  Encounter Vitals Group     BP      Systolic BP Percentile      Diastolic BP Percentile      Pulse      Resp      Temp      Temp src      SpO2      Weight      Height      Head Circumference      Peak Flow      Pain Score      Pain Loc      Pain Education  Exclude from Growth Chart    No data found.  Updated Vital Signs BP 118/75 (BP Location: Right Arm)   Pulse 85   Temp 98.5 F (36.9 C) (Oral)   Resp 16   Ht 5\' 4"  (1.626 m)   Wt 145 lb (65.8 kg)   SpO2 96%   BMI 24.89 kg/m   Visual Acuity Right Eye Distance:   Left Eye Distance:   Bilateral Distance:    Right Eye Near:   Left Eye Near:    Bilateral Near:     Physical Exam Vitals and nursing note reviewed.  Constitutional:      Appearance: Normal appearance. He is normal weight.  HENT:     Head: Normocephalic and atraumatic.     Mouth/Throat:     Mouth: Mucous membranes are moist.     Pharynx: Oropharynx is clear.  Eyes:     Extraocular Movements: Extraocular movements intact.     Conjunctiva/sclera: Conjunctivae normal.     Pupils: Pupils are equal, round, and reactive to light.  Cardiovascular:     Rate and Rhythm: Normal rate and regular rhythm.     Pulses: Normal  pulses.     Heart sounds: Normal heart sounds.  Pulmonary:     Effort: Pulmonary effort is normal.     Breath sounds: Normal breath sounds. No wheezing, rhonchi or rales.  Musculoskeletal:        General: Normal range of motion.     Cervical back: Normal range of motion and neck supple.  Skin:    General: Skin is warm and dry.  Neurological:     General: No focal deficit present.     Mental Status: He is alert and oriented to person, place, and time. Mental status is at baseline.  Psychiatric:        Mood and Affect: Mood normal.        Behavior: Behavior normal.        Thought Content: Thought content normal.      UC Treatments / Results  Labs (all labs ordered are listed, but only abnormal results are displayed) Labs Reviewed - No data to display  EKG   Radiology No results found.  Procedures Procedures (including critical care time)  Medications Ordered in UC Medications - No data to display  Initial Impression / Assessment and Plan / UC Course  I have reviewed the triage vital signs and the nursing notes.  Pertinent labs & imaging results that were available during my care of the patient were reviewed by me and considered in my medical decision making (see chart for details).     MDM: 1.  Left-sided chest wall pain-Advised watchful waiting at this point.  2.  Chest pain varying with breathing-advised patient if symptoms worsen and/or unresolved please follow-up with PCP nearest ED or here for further evaluation.  Patient discharged home, hemodynamically stable. Advised watchful waiting at this point.  Advised patient if symptoms worsen and/or unresolved please follow-up with PCP nearest ED or here for further evaluation.  Patient discharged home, hemodynamically stable. Final Clinical Impressions(s) / UC Diagnoses   Final diagnoses:  Left-sided chest wall pain  Chest pain varying with breathing     Discharge Instructions      Advised watchful waiting at  this point.  Advised patient if symptoms worsen and/or unresolved please follow-up with PCP nearest ED or here for further evaluation.     ED Prescriptions   None    PDMP not reviewed this  encounter.   Trevor Iha, FNP 01/20/23 2003

## 2023-05-28 NOTE — Progress Notes (Signed)
 Cardiology Office Note    Patient Name: Erika Estrada Date of Encounter: 05/28/2023  Primary Care Provider:  Corlis Longs, FNP Primary Cardiologist:  Erika Reek, MD Primary Electrophysiologist: None   Past Medical History    Past Medical History:  Diagnosis Date   ADHD    Autism    Body integrity dysphoria    Palpitation    PCOS (polycystic ovarian syndrome)     History of Present Illness  Erika Estrada is a 23 y.o. adult with a PMH of palpitations, left-sided chest pain, mild OSA, Crohn's disease, autism, ADHD who presents today for hospital follow-up.  Erika Estrada seen initially by Dr. Reek in 12/2021 with complaint of palpitations and dizziness.  These were more prominent with standing heart rate also was elevated.  He wore an event monitor for 2 weeks for further evaluation that showed sinus rhythm with no evidence of AF or pathologic arrhythmias.  2D echo was also completed that showed normal EF of 55 to 60% with no valvular abnormalities.  He underwent recent sleep study and was found to have mild OSA and declined CPAP therapy at that time.   Erika Estrada presents today for follow-up.  He has a PMH significant for anxiety and Crohn's disease, presents with episodes of sudden heart rate spikes, palpitations, lightheadedness, shortness of breath, shaking limbs, and feeling extremely cold. These episodes, lasting for about a minute or two, occur randomly, often when the patient is at rest, and leave the patient shaky afterwards. The patient denies these episodes being panic attacks, differentiating them from their known anxiety symptoms. The patient reports a similar episode in the past, for which they underwent cardiac monitoring and an ultrasound of the heart. The patient also mentions a family history of atrial fibrillation (AFib). The patient's diet includes soda and occasional alcohol, but no recent changes in diet or supplements have been reported. The patient also mentions a  recent flare-up of their Crohn's disease, which became active again a month before the episodes started.  Patient denies chest pain, , dyspnea, PND, orthopnea, nausea, vomiting, dizziness, syncope, edema, weight gain, or early satiety.   Review of Systems  Please see the history of present illness.    All other systems reviewed and are otherwise negative except as noted above.  Physical Exam    Wt Readings from Last 3 Encounters:  01/20/23 145 lb (65.8 kg)  11/12/22 140 lb (63.5 kg)  12/14/21 145 lb (65.8 kg)   CD:Uyzmz were no vitals filed for this visit.,There is no height or weight on file to calculate BMI. GEN: Well nourished, well developed in no acute distress Neck: No JVD; No carotid bruits Pulmonary: Clear to auscultation without rales, wheezing or rhonchi  Cardiovascular: Normal rate. Regular rhythm. Normal S1. Normal S2.   Murmurs: There is no murmur.  ABDOMEN: Soft, non-tender, non-distended EXTREMITIES:  No edema; No deformity   EKG/LABS/ Recent Cardiac Studies   ECG personally reviewed by me today -sinus tachycardia with a rate of 96 bpm and no acute changes consistent with previous EKG.  Risk Assessment/Calculations:          Lab Results  Component Value Date   WBC 4.9 11/24/2021   HGB 15.5 11/24/2021   HCT 45.9 (H) 11/24/2021   MCV 92.4 11/24/2021   PLT 234 11/24/2021   Lab Results  Component Value Date   CREATININE 0.81 11/24/2021   BUN 12 11/24/2021   NA 139 11/24/2021   K 4.3 11/24/2021   CL 104  11/24/2021   CO2 23 11/24/2021   No results found for: CHOL, HDL, LDLCALC, LDLDIRECT, TRIG, CHOLHDL  No results found for: HGBA1C Assessment & Plan    1.  History of palpitations: -Patient reports sudden episodes of tachycardia, lightheadedness, shaking, and cold extremities, often occurring in the morning or while eating. No clear triggers identified. Previous 14-day heart monitor showed sinus tachycardia  -Family history of atrial  fibrillation (AFib) in Erika Estrada. -Patient recently completed 14-day ZIO and will await results which are pending -Refer to a new cardiologist for further evaluation. -Consider referral to an electrophysiologist if no clear cause is identified. Patient reports drinking soda and occasional alcohol, which could potentially trigger tachycardia episodes. -Advise patient to avoid caffeine, alcohol, and other potential triggers while wearing the heart monitor.  2.  Sinus tachycardia/possible POTS: Patient reports feeling hot with cold extremities during episodes, which could suggest blood pooling or orthostatic changes. Previous orthostatic blood pressure measurements showed a slight increase in heart rate upon standing. -Perform orthostatic blood pressure measurements during current visit. -Advise patient to increase water and salt intake, and consider compression stockings if orthostatic hypotension is confirmed.  3.  Active Crohn's Disease Patient reports a recent flare of Crohn's disease due to building up antibodies to current medication. Unclear if related to tachycardia episodes. -Continue current management plan for Crohn's disease.   4.  Anxiety: -Patient reports lots of life stressors and also has been dealing with active Crohn's disease -Patient reports feeling hot and shakiness with palpitations may be an element of anxiety -We will continue to monitor at this time.  Disposition: Follow-up with Erika Reek, MD or APP in 4 months    Signed, Wyn Raddle, Jackee Shove, NP 05/28/2023, 2:17 PM [facility]

## 2023-05-29 ENCOUNTER — Ambulatory Visit: Payer: PRIVATE HEALTH INSURANCE | Attending: Nurse Practitioner | Admitting: Nurse Practitioner

## 2023-05-29 ENCOUNTER — Encounter: Payer: Self-pay | Admitting: Nurse Practitioner

## 2023-05-29 VITALS — BP 97/74 | HR 96 | Ht 64.0 in | Wt 137.0 lb

## 2023-05-29 DIAGNOSIS — I479 Paroxysmal tachycardia, unspecified: Secondary | ICD-10-CM | POA: Diagnosis not present

## 2023-05-29 DIAGNOSIS — F419 Anxiety disorder, unspecified: Secondary | ICD-10-CM | POA: Diagnosis not present

## 2023-05-29 DIAGNOSIS — R002 Palpitations: Secondary | ICD-10-CM

## 2023-05-29 DIAGNOSIS — G90A Postural orthostatic tachycardia syndrome (POTS): Secondary | ICD-10-CM | POA: Diagnosis not present

## 2023-05-29 DIAGNOSIS — G4733 Obstructive sleep apnea (adult) (pediatric): Secondary | ICD-10-CM

## 2023-05-29 NOTE — Patient Instructions (Addendum)
 Medication Instructions:  Your physician recommends that you continue on your current medications as directed. Please refer to the Current Medication list given to you today.  *If you need a refill on your cardiac medications before your next appointment, please call your pharmacy*   Please mail in your monitor. Increase salt intake. Wear compression socks. Lab Work: None ordered.  If you have labs (blood work) drawn today and your tests are completely normal, you will receive your results only by: MyChart Message (if you have MyChart) OR A paper copy in the mail If you have any lab test that is abnormal or we need to change your treatment, we will call you to review the results.  Testing/Procedures: None ordered.  Follow-Up: At [facility], you and your health needs are our priority.  As part of our continuing mission to provide you with exceptional heart care, we have created designated Provider Care Teams.  These Care Teams include your primary Cardiologist (physician) and Advanced Practice Providers (APPs -  Physician Assistants and Nurse Practitioners) who all work together to provide you with the care you need, when you need it.  We recommend signing up for the patient portal called MyChart.  Sign up information is provided on this After Visit Summary.  MyChart is used to connect with patients for Virtual Visits (Telemedicine).  Patients are able to view lab/test results, encounter notes, upcoming appointments, etc.  Non-urgent messages can be sent to your provider as well.   To learn more about what you can do with MyChart, go to [email or web address].    Your next appointment:   4 months  The format for your next appointment:   In Person  Provider:   General Cardiologist     Important Information About Sugar

## 2023-07-12 ENCOUNTER — Telehealth: Payer: Self-pay | Admitting: Nurse Practitioner

## 2023-07-12 NOTE — Telephone Encounter (Signed)
 Left voicemail to return call to office

## 2023-07-12 NOTE — Telephone Encounter (Signed)
 Pt called in asking if about heart monitor. He stated at his last appt they dicussed they will call him back once results from ED he was at were obtained. Please advise.

## 2023-07-17 NOTE — Telephone Encounter (Signed)
 Pt returning call

## 2023-07-18 NOTE — Telephone Encounter (Signed)
 Spoke with patient and he is aware we will need a release of medical information so we can request records.   Erika Estrada will reach out to patient

## 2023-09-08 ENCOUNTER — Other Ambulatory Visit: Payer: Self-pay

## 2023-09-08 ENCOUNTER — Ambulatory Visit
Admission: EM | Admit: 2023-09-08 | Discharge: 2023-09-08 | Disposition: A | Discharge status: Home / Self Care | Payer: PRIVATE HEALTH INSURANCE | Attending: Family Medicine | Admitting: Family Medicine

## 2023-09-08 DIAGNOSIS — J020 Streptococcal pharyngitis: Secondary | ICD-10-CM

## 2023-09-08 LAB — POCT RAPID STREP A (OFFICE): Rapid Strep A Screen: POSITIVE — AB

## 2023-09-08 LAB — POC SARS CORONAVIRUS 2 AG -  ED: SARS Coronavirus 2 Ag: NEGATIVE

## 2023-09-08 LAB — POCT INFLUENZA A/B
Influenza A, POC: NEGATIVE
Influenza B, POC: NEGATIVE

## 2023-09-08 MED ORDER — PENICILLIN V POTASSIUM 500 MG PO TABS: ORAL_TABLET | ORAL | 0 refills | Status: DC

## 2023-09-08 NOTE — ED Triage Notes (Addendum)
 C/o sore throat, nausea, vomiting, stomach cramps, constipation, headache, lightheadedness, muscle weakness since Monday. Unsure of fever. Has had tylenol, zofran, claritin, cough drops.

## 2023-09-08 NOTE — ED Provider Notes (Signed)
 [facility]    CSN: [id number] Arrival date & time: 09/08/23  1205      History   Chief Complaint Chief Complaint  Patient presents with   Sore Throat   Emesis    HPI Erika Estrada is a 23 y.o. adult.   Patient has a history of frequent stomach cramps, nausea, and vomiting since beginning infusions for Crohn disease.  These symptoms have become worse during the past five days, with new symptoms of sore throat, constipation, stomach cramps, light-headedness, occasional cough, sinus congestion, headache, and muscle weakness.  Patient is concerned that worsening symptoms may be a result of Crohn disease treatment.  The history is provided by the patient.    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 No pertinent surgical history.  OB History   No obstetric history on file.      Home Medications    Prior to Admission medications   Medication Sig Start Date End Date Taking? Authorizing Provider  inFLIXimab-axxq (AVSOLA) 100 MG injection Inject into the vein.   Yes [provider]  penicillin v potassium (VEETID) 500 MG tablet Take one tab by mouth Q12 hours for 10 days 09/08/23  Yes Leon Rajas, MD    Family History Family History  Problem Relation Age of Onset   Healthy Mother    Hypertension Father    Crohn's disease Sister    Crohn's disease Brother    Healthy Sister     Social History Social History   Tobacco Use   Smoking status: Every Day    Types: E-cigarettes   Smokeless tobacco: Never  Vaping  Use   Vaping status: Every Day   Substances: Nicotine, Flavoring  Substance Use Topics   Alcohol use: Never   Drug use: Never     Allergies   Pineapple   Review of Systems Review of Systems + sore throat + cough No pleuritic pain No wheezing + nasal congestion ? post-nasal drainage No sinus pain/pressure No itchy/red eyes No earache No hemoptysis No SOB ? fever + nausea + vomiting + abdominal pain No diarrhea + constipation No urinary symptoms No skin rash + fatigue + myalgias + headache Used OTC meds (Tylenol, Claritin, cough drops) without relief   Physical Exam Triage Vital Signs ED Triage Vitals  Encounter Vitals Group     BP 09/08/23 1212 119/73     Systolic BP Percentile --      Diastolic BP Percentile --      Pulse Rate 09/08/23 1212 (!) 105     Resp 09/08/23 1212 16     Temp 09/08/23 1212 98.1 F (36.7 C)     Temp Source 09/08/23 1212 Oral     SpO2 09/08/23 1212 96 %     Weight --      Height --      Head Circumference --      Peak Flow --      Pain Score 09/08/23 1215 4     Pain Loc --  Pain Education --      Exclude from Growth Chart --    No data found.  Updated Vital Signs BP 119/73   Pulse (!) 105   Temp 98.1 F (36.7 C) (Oral)   Resp 16   SpO2 96%   Visual Acuity Right Eye Distance:   Left Eye Distance:   Bilateral Distance:    Right Eye Near:   Left Eye Near:    Bilateral Near:     Physical Exam Nursing notes and Vital Signs reviewed. Appearance:  Patient appears stated age, and in no acute distress Eyes:  Pupils are equal, round, and reactive to light and accomodation.  Extraocular movement is intact.  Conjunctivae are not inflamed  Ears:  Canals normal.  Tympanic membranes normal.  Nose:  Normal turbinates.  No sinus tenderness.  Pharynx: Minimal erythema. Neck:  Supple.  Tonsillar nodes tender to palpation.  Lungs:  Clear to auscultation.  Breath sounds are equal.  Moving air well. Heart:  Regular rate and  rhythm without murmurs, rubs, or gallops.  Abdomen:  Nontender without masses or hepatosplenomegaly.  Bowel sounds are present.  No CVA or flank tenderness.  Extremities:  No edema.  Skin:  No rash present.   UC Treatments / Results  Labs (all labs ordered are listed, but only abnormal results are displayed) Labs Reviewed  POCT RAPID STREP A (OFFICE) - Abnormal; Notable for the following components:      Result Value   Rapid Strep A Screen Positive (*)    All other components within normal limits  POCT INFLUENZA A/B negative  POC SARS CORONAVIRUS 2 AG -  ED negative    EKG   Radiology No results found.  Procedures Procedures (including critical care time)  Medications Ordered in UC Medications - No data to display  Initial Impression / Assessment and Plan / UC Course  I have reviewed the triage vital signs and the nursing notes.  Pertinent labs & imaging results that were available during my care of the patient were reviewed by me and considered in my medical decision making (see chart for details).    Patient's array of symptoms seem more extensive than those that would be caused by strep pharyngitis alone. Begin penicillin VK for 10 days. Followup with Family Doctor if pharyngitis not improved in one week. Followup with GI for persistent nausea and abdominal cramps, etc.  Final Clinical Impressions(s) / UC Diagnoses   Final diagnoses:  Strep pharyngitis     Discharge Instructions      Try warm salt water gargles for sore throat.     ED Prescriptions     Medication Sig Dispense Auth. Provider   penicillin v potassium (VEETID) 500 MG tablet Take one tab by mouth Q12 hours for 10 days 20 tablet Leon Rajas, MD         Leon Rajas, MD 09/10/23 [phone]

## 2023-09-08 NOTE — Discharge Instructions (Signed)
Try warm salt water gargles for sore throat.   °

## 2023-10-02 ENCOUNTER — Ambulatory Visit: Payer: PRIVATE HEALTH INSURANCE | Attending: Cardiology | Admitting: Cardiology

## 2023-10-02 ENCOUNTER — Encounter: Payer: Self-pay | Admitting: Cardiology

## 2023-10-02 VITALS — BP 98/72 | HR 78 | Ht 64.0 in | Wt 130.6 lb

## 2023-10-02 DIAGNOSIS — F419 Anxiety disorder, unspecified: Secondary | ICD-10-CM | POA: Diagnosis not present

## 2023-10-02 DIAGNOSIS — K5000 Crohn's disease of small intestine without complications: Secondary | ICD-10-CM | POA: Diagnosis not present

## 2023-10-02 DIAGNOSIS — G90A Postural orthostatic tachycardia syndrome (POTS): Secondary | ICD-10-CM | POA: Diagnosis not present

## 2023-10-02 DIAGNOSIS — R002 Palpitations: Secondary | ICD-10-CM

## 2023-10-02 NOTE — Progress Notes (Signed)
 Cardiology Office Note   Date:  10/02/2023   ID:  Erika Estrada, DOB 2000/05/19, MRN [id number]  PCP:  Chana Comas, FNP    Chief Complaint  Patient presents with   Follow-up    Palpitations, possible POTS, anxiety     Wt Readings from Last 3 Encounters:  10/02/23 130 lb 9.6 oz (59.2 kg)  05/29/23 137 lb (62.1 kg)  01/20/23 145 lb (65.8 kg)       History of Present Illness: Erika Estrada is a 23 y.o. adult with a history of palpitations, Crohn's disease with history of rectal bleeding.  He has a history of anxiety, ADHD, autism and PCOS.  Patient wore a heart monitor in 01/02/2022 showing rare PVCs and PACs.  2D echo showed normal LV function EF 55 to 60% with normal RV trivial MR.  There is some thoughts that he may have POTS and it was encouraged that he increase salt intake.  Seen back by Charles Connor 05/29/2023 he was complaining of feeling hot with cold extremities during episodes of palpitations.  Previous orthostatic blood pressure measurements of showed a slight increase in heart rate upon standing.  He was instructed to increase water and salt intake and use compression stockings.    He is here today for followup and is doing well.  He denies any chest pain or pressure, SOB, DOE, PND, orthopnea, LE edema, dizziness, palpitations or syncope. He is compliant with his meds and is tolerating meds with no SE.    Past Medical History:  Diagnosis Date   ADHD    Autism    Body integrity dysphoria    Palpitation    PCOS (polycystic ovarian syndrome)     History reviewed. No pertinent surgical history.   Current Outpatient Medications  Medication Sig Dispense Refill   inFLIXimab-axxq (AVSOLA) 100 MG injection Inject into the vein.     ondansetron (ZOFRAN) 8 MG tablet      No current facility-administered medications for this visit.    Allergies:   Pineapple    Social History:  The patient  reports that he has been smoking e-cigarettes. He has never used smokeless  tobacco. He reports that he does not drink alcohol and does not use drugs.   Family History:  The patient's family history includes Crohn's disease in his brother and sister; Healthy in his mother and sister; Hypertension in his father. Grandmother with tachycardia, ablation in her 66s.    ROS:  Please see the history of present illness.   Otherwise, review of systems are positive for palpitations.   All other systems are reviewed and negative.    PHYSICAL EXAM: VS:  BP 98/72   Pulse 78   Ht 5\' 4"  (1.626 m)   Wt 130 lb 9.6 oz (59.2 kg)   SpO2 98%   BMI 22.42 kg/m  , BMI Body mass index is 22.42 kg/m. GEN: Well nourished, well developed in no acute distress HEENT: Normal NECK: No JVD; No carotid bruits LYMPHATICS: No lymphadenopathy CARDIAC:RRR, no murmurs, rubs, gallops RESPIRATORY:  Clear to auscultation without rales, wheezing or rhonchi  ABDOMEN: Soft, non-tender, non-distended MUSCULOSKELETAL:  No edema; No deformity  SKIN: Warm and dry NEUROLOGIC:  Alert and oriented x 3 PSYCHIATRIC:  Normal affect     Recent Labs: No results found for requested labs within last 365 days.   Lipid Panel No results found for: "CHOL", "TRIG", "HDL", "CHOLHDL", "VLDL", "LDLCALC", "LDLDIRECT"   Other studies Reviewed: Additional studies/ records that were  reviewed today with results demonstrating: ER records reviewed.   ASSESSMENT AND PLAN:  #History of palpitations: -Patient reports sudden episodes of tachycardia, lightheadedness, shaking, and cold extremities, often occurring in the morning or while eating. No clear triggers identified. Previous 14-day heart monitor showed sinus tachycardia  -Family history of atrial fibrillation (AFib) in uncle. -Patient  completed 14-day ZIO which demonstrated rare PACs and PVCs -Patient reports drinking soda and occasional alcohol, which could potentially trigger tachycardia episodes.  -Encouraged patient to cut out all alcohol and caffeine -He  tells me he had another heart monitor that he wore a few months ago when he went to [facility] ER with an episode of severe palpitations while sitting down.  He says it came out of the blue and his heart rate went from 80 to 140 bpm he got sweaty and nauseated and had a feeling of impending doom.  Apparently they ordered a heart monitor which I do not have access to at this time and was told to see a cardiologist. -I will try to get a copy of this and if there is any significant abnormality we will call him to discuss further  #Sinus tachycardia/possible POTS: -Patient reports feeling hot with cold extremities during episodes of palpitations in the past, which could suggest blood pooling or orthostatic changes. Previous orthostatic blood pressure measurements showed a slight increase in heart rate upon standing. - Orthostatic blood pressure today are normal>> he complained of dizziness and nausea going from sitting to standing despite a blood pressure of 113/76 increase in 119/80 mmHg when sitting. - Continue to drink least 64 to 100 ounces of water daily and increase salt intake.  Also important to stay hydrated during Crohn flares  #Active Crohn's Disease -Continue current management plan for Crohn's disease.   #Anxiety: -Patient reports lots of life stressors and also has been dealing with active Crohn's disease -Patient reports feeling hot and shakiness with palpitations may be an element of anxiety - Suggest nothing on event monitor except rare PACs and PVCs  Suspect a lot of his issues are related to anxiety as well as 2 episodes of Crohn's disease where he has a flare and maybe gets dehydrated.  He needs to follow-up with PCP going forward   Current medicines are reviewed at length with the patient today.  The patient concerns regarding his medicines were addressed.  The following changes have been made: None  Labs/ tests ordered today include: None  No orders of the defined types were  placed in this encounter.   Follow-up as needed   Signed, Gaylyn Keas, MD  10/02/2023 3:43 PM    [facility] [address] [place], [place], [place]  [address] Phone: [phone]; Fax: [phone]  Okay this is annoying enough to start looking

## 2023-10-02 NOTE — Patient Instructions (Addendum)
 Medication Instructions:  Your physician recommends that you continue on your current medications as directed. Please refer to the Current Medication list given to you today.  *If you need a refill on your cardiac medications before your next appointment, please call your pharmacy*  Lab Work: None.  If you have labs (blood work) drawn today and your tests are completely normal, you will receive your results only by: MyChart Message (if you have MyChart) OR A paper copy in the mail If you have any lab test that is abnormal or we need to change your treatment, we will call you to review the results.  Testing/Procedures: None.  Follow-Up: At [facility], you and your health needs are our priority.  As part of our continuing mission to provide you with exceptional heart care, our providers are all part of one team.  This team includes your primary Cardiologist (physician) and Advanced Practice Providers or APPs (Physician Assistants and Nurse Practitioners) who all work together to provide you with the care you need, when you need it.  Your next appointment will be dependent upon the receipt of your medical records and it will be with:     Provider:   Dr. Gaylyn Keas, MD     Other Instructions Please fill out a release form at the front desk so we can obtain medical records from  your prior doctor.

## 2024-01-05 ENCOUNTER — Telehealth: Payer: Self-pay

## 2024-01-05 NOTE — Telephone Encounter (Signed)
-----   Message from Wilbert Bihari sent at 12/08/2023  1:19 PM EDT ----- So unfortunately this only includes the ER visits but they ordered a Zio patch and we need to try to get the results of that ----- Message ----- From: Janit Geni CROME, RN Sent: 12/07/2023   5:47 PM EDT To: Wilbert JONELLE Bihari, MD  ER records came through

## 2024-01-05 NOTE — Telephone Encounter (Signed)
 Call to patient to ask for assistance in obtaining zio results from stay at [facility]. No answer, no DPR on file, left message with no identifiers asking recipient to call our office.   Call to [facility], Medical Records states they cannot give me records but will not explain why. They seem to be an outsourced company, they are saying they do not have access to those records and patient may need to fill out a separate form for them.

## 2024-01-08 ENCOUNTER — Telehealth: Payer: Self-pay

## 2024-01-08 DIAGNOSIS — R002 Palpitations: Secondary | ICD-10-CM

## 2024-01-08 NOTE — Telephone Encounter (Signed)
 Patient calling in to discuss home heart monitor that was ordered for them at [facility] in December 2024. Patient states they did wear the heart monitor for a week and sent the device back in the mail. However, they never received results. I walked the patient through creating a patient account on the [facility] portal but they were unable to access their records. I have personally called the medical records department at [facility] twice and was told they could not access any heart monitor results.   I asked patient to call [facility] to try and get a physical copy of the Zio report, as well as call their PCP at the time to see if they received the results. Patient states they would prefer at this time to request a new cardiologist at this practice and see that cardiologist will order a new zio heart monitor for them.

## 2024-01-16 NOTE — Addendum Note (Signed)
 Addended by: JANIT GENI CROME on: 01/16/2024 05:21 PM   Modules accepted: Orders

## 2024-01-16 NOTE — Telephone Encounter (Signed)
 Attempted to call patient to advise Dr. Shlomo will order new Zio. No answer, no dpr on file. Left VM asking recipient to call [place] at our #. [facility] also sent.

## 2024-01-17 ENCOUNTER — Ambulatory Visit: Payer: PRIVATE HEALTH INSURANCE | Attending: Cardiology

## 2024-01-17 DIAGNOSIS — R002 Palpitations: Secondary | ICD-10-CM

## 2024-01-17 NOTE — Progress Notes (Unsigned)
 Enrolled for [employer] to mail a ZIO XT long term holter monitor to the patients address on file.

## 2024-01-18 ENCOUNTER — Ambulatory Visit
Admission: EM | Admit: 2024-01-18 | Discharge: 2024-01-18 | Disposition: A | Discharge status: Home / Self Care | Payer: PRIVATE HEALTH INSURANCE | Attending: Family Medicine | Admitting: Family Medicine

## 2024-01-18 ENCOUNTER — Encounter: Payer: Self-pay | Admitting: Emergency Medicine

## 2024-01-18 DIAGNOSIS — U071 COVID-19: Secondary | ICD-10-CM | POA: Diagnosis not present

## 2024-01-18 LAB — POCT INFLUENZA A/B
Influenza A, POC: NEGATIVE
Influenza B, POC: NEGATIVE

## 2024-01-18 LAB — POC SARS CORONAVIRUS 2 AG -  ED: SARS Coronavirus 2 Ag: POSITIVE — AB

## 2024-01-18 LAB — POCT RAPID STREP A (OFFICE): Rapid Strep A Screen: NEGATIVE

## 2024-01-18 MED ORDER — ONDANSETRON 8 MG PO TBDP: 8.0000 mg | ORAL_TABLET | Freq: Three times a day (TID) | ORAL | 0 refills | Status: AC | PRN

## 2024-01-18 MED ORDER — PAXLOVID (300/100) 20 X 150 MG & 10 X 100MG PO TBPK: 3.0000 | ORAL_TABLET | Freq: Two times a day (BID) | ORAL | 0 refills | Status: AC

## 2024-01-18 NOTE — Discharge Instructions (Signed)
 Take Paxlovid  2 times a day for 5 days Make sure you are drinking lots of water Take the Zofran  if needed for nausea Recommend you stay home from work until you have had no fever for 24 hours and your symptoms are improving

## 2024-01-18 NOTE — ED Triage Notes (Signed)
 Patient presents to Urgent Care with complaints of sore throat, fatigue, abdominal cramping, diarrhea, cough, stuffy nose, runny nose, low grade fever 103F since 2 days ago. Patient reports symptoms worsening. Took Tylenol for symptoms. Wants strep, covid and flu test. Did take his Remicade this week.

## 2024-01-18 NOTE — ED Provider Notes (Signed)
 [facility]    CSN: [id number] Arrival date & time: 01/18/24  1834      History   Chief Complaint Chief Complaint  Patient presents with   Fever   URI    HPI Erika Estrada is a 23 y.o. adult.   Patient is here for evaluation of fever.  Has a runny and stuffy nose, low-grade fever, fatigue.  He has had some abdominal cramping diarrhea and decreased appetite.  Requesting strep COVID and flu testing.  Is on Remicade which reduces immunity.    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 No pertinent surgical history.  OB History   No obstetric history on file.      Home Medications    Prior to Admission medications   Medication Sig Start Date End Date Taking? Authorizing Provider  inFLIXimab-axxq (AVSOLA) 100 MG injection Inject into the vein.   Yes [provider]  nirmatrelvir/ritonavir (PAXLOVID , 300/100,) 20 x 150 MG & 10 x 100MG  TBPK Take 3 tablets by mouth 2 (two) times daily for 5 days. Patient GFR is 60  Take nirmatrelvir (150 mg) two tablets twice daily for 5 days and ritonavir (100 mg) one tablet twice daily for 5 days. 01/18/24 01/23/24 Yes Maranda Jamee Jacob, MD  ondansetron  (ZOFRAN ) 8 MG tablet    Yes [provider]  ondansetron  (ZOFRAN -ODT) 8 MG disintegrating tablet Take 1 tablet (8 mg total) by mouth every 8 (eight) hours as needed for nausea or vomiting. 01/18/24  Yes Maranda Jamee Jacob, MD    Family History Family History  Problem Relation Age of Onset   Healthy Mother    Hypertension Father     Crohn's disease Sister    Crohn's disease Brother    Healthy Sister     Social History Social History   Tobacco Use   Smoking status: Every Day    Types: E-cigarettes   Smokeless tobacco: Never  Vaping Use   Vaping status: Every Day   Substances: Nicotine, Flavoring  Substance Use Topics   Alcohol use: Never   Drug use: Never     Allergies   Pineapple   Review of Systems Review of Systems See HPI  Physical Exam Triage Vital Signs ED Triage Vitals  Encounter Vitals Group     BP 01/18/24 1846 116/80     Girls Systolic BP Percentile --      Girls Diastolic BP Percentile --      Boys Systolic BP Percentile --      Boys Diastolic BP Percentile --      Pulse Rate 01/18/24 1846 74     Resp 01/18/24 1846 16     Temp 01/18/24 1846 98.2 F (36.8 C)     Temp Source 01/18/24 1846 Oral     SpO2 01/18/24 1846 97 %     Weight --      Height --      Head Circumference --      Peak Flow --      Pain Score  01/18/24 1844 3     Pain Loc --      Pain Education --      Exclude from Growth Chart --    No data found.  Updated Vital Signs BP 116/80 (BP Location: Right Arm)   Pulse 74   Temp 98.2 F (36.8 C) (Oral)   Resp 16   SpO2 97%    Physical Exam Constitutional:      General: He is not in acute distress.    Appearance: He is well-developed. He is ill-appearing.  HENT:     Head: Normocephalic and atraumatic.  Eyes:     Conjunctiva/sclera: Conjunctivae normal.     Pupils: Pupils are equal, round, and reactive to light.  Cardiovascular:     Rate and Rhythm: Normal rate.  Pulmonary:     Effort: Pulmonary effort is normal. No respiratory distress.  Abdominal:     General: There is no distension.     Palpations: Abdomen is soft.  Musculoskeletal:        General: Normal range of motion.     Cervical back: Normal range of motion.  Skin:    General: Skin is warm and dry.  Neurological:     Mental Status: He is alert.    GFR last month was 126  UC  Treatments / Results  Labs (all labs ordered are listed, but only abnormal results are displayed) Labs Reviewed  POC SARS CORONAVIRUS 2 AG -  ED - Abnormal; Notable for the following components:      Result Value   SARS Coronavirus 2 Ag Positive (*)    All other components within normal limits  POCT INFLUENZA A/B - Normal  POCT RAPID STREP A (OFFICE)    EKG   Radiology No results found.  Procedures Procedures (including critical care time)  Medications Ordered in UC Medications - No data to display  Initial Impression / Assessment and Plan / UC Course  I have reviewed the triage vital signs and the nursing notes.  Pertinent labs & imaging results that were available during my care of the patient were reviewed by me and considered in my medical decision making (see chart for details).     Discussed medication treatment regarding COVID-19.  Patient desires Paxlovid .  He is immune compromised because of long-term biologic use for his Crohn's disease.  Discussed home care Final Clinical Impressions(s) / UC Diagnoses   Final diagnoses:  COVID-19     Discharge Instructions      Take Paxlovid  2 times a day for 5 days Make sure you are drinking lots of water Take the Zofran  if needed for nausea Recommend you stay home from work until you have had no fever for 24 hours and your symptoms are improving     ED Prescriptions     Medication Sig Dispense Auth. Provider   nirmatrelvir/ritonavir (PAXLOVID , 300/100,) 20 x 150 MG & 10 x 100MG  TBPK Take 3 tablets by mouth 2 (two) times daily for 5 days. Patient GFR is 60  Take nirmatrelvir (150 mg) two tablets twice daily for 5 days and ritonavir (100 mg) one tablet twice daily for 5 days. 30 tablet Maranda Jamee Jacob, MD   ondansetron  (ZOFRAN -ODT) 8 MG disintegrating tablet Take 1 tablet (8 mg total) by mouth every 8 (eight) hours as needed for nausea or vomiting. 20 tablet Maranda Jamee Jacob, MD      PDMP not reviewed this  encounter.   Maranda Jamee Jacob, MD 01/18/24 Erika Estrada
